# Patient Record
Sex: Female | Born: 1985 | Race: White | Hispanic: No | Marital: Single | Smoking: Current every day smoker
Health system: Southern US, Community
[De-identification: ages and names within clinical notes are randomized; demographics above are authoritative.]

## PROBLEM LIST (undated history)

## (undated) DIAGNOSIS — E079 Disorder of thyroid, unspecified: Secondary | ICD-10-CM

---

## 2016-07-21 ENCOUNTER — Emergency Department (HOSPITAL_COMMUNITY)
Admission: EM | Admit: 2016-07-21 | Discharge: 2016-07-21 | Disposition: A | Discharge status: Home / Self Care | Payer: Self-pay | Attending: Emergency Medicine | Admitting: Emergency Medicine

## 2016-07-21 ENCOUNTER — Encounter (HOSPITAL_COMMUNITY): Payer: Self-pay

## 2016-07-21 DIAGNOSIS — F17200 Nicotine dependence, unspecified, uncomplicated: Secondary | ICD-10-CM | POA: Insufficient documentation

## 2016-07-21 DIAGNOSIS — K069 Disorder of gingiva and edentulous alveolar ridge, unspecified: Secondary | ICD-10-CM | POA: Insufficient documentation

## 2016-07-21 DIAGNOSIS — K0889 Other specified disorders of teeth and supporting structures: Secondary | ICD-10-CM | POA: Insufficient documentation

## 2016-07-21 MED ORDER — LIDOCAINE VISCOUS 2 % MT SOLN: 15.0000 mL | OROMUCOSAL | 2 refills | Status: AC | PRN

## 2016-07-21 NOTE — Discharge Instructions (Signed)
You have been seen today for dental pain. You should follow up with a dentist as soon as possible. I have attached the resource guide below. This problem will not resolve on its own without the care of a dentist. Use ibuprofen or naproxen for pain. Use the viscous lidocaine for mouth pain. Swish with the lidocaine and spit it out. Do not swallow it. Please take OTC Omeprazole if you take ibuprofen daily. If you develop worsening or new concerning symptoms you can return to the emergency department for re-evaluation.

## 2016-07-21 NOTE — ED Triage Notes (Signed)
PT states her gums are receding and has gotten worse in the last week causing shooting pain to front lower teeth. PT states she saw a periodontist who told her she needs a graft, but she can not afford it

## 2016-07-21 NOTE — ED Provider Notes (Signed)
[facility]-EMERGENCY DEPT Provider Note   CSN: [id number] Arrival date & time: 07/21/16  1929     History   Chief Complaint Chief Complaint  Patient presents with  . Dental Pain    HPI Lori FosterJamie Sopp is a 31 y.o. female who presents with dental pain x 1 week. The patient was told 2 years ago by a periodontist that she would need a gum graft for her front bottom gums but was unable to afford it at that tiem. Over the last week she states that her gum recession has become increasingly worse and is now causing a shooting pain of the front lower teeth whenever she eats. The patient has been taking 800mg  Ibuprofen BID with relief. She is requesting dental resources so she can be seen and get this taken care of. She denies fever, chills, difficulty swallowing, inability to handle her secretions, sore throat, shortness of breath, drainage, or change in voice.    HPI  History reviewed. No pertinent past medical history.  There are no active problems to display for this patient.   History reviewed. No pertinent surgical history.  OB History    No data available       Home Medications    Prior to Admission medications   Medication Sig Start Date End Date Taking? Authorizing Provider  lidocaine (XYLOCAINE) 2 % solution Use as directed 15 mLs in the mouth or throat as needed for mouth pain. 07/21/16   Mithran Strike, Elmer SowMichael M, PA-C    Family History No family history on file.  Social History Social History  Substance Use Topics  . Smoking status: Current Every Day Smoker  . Smokeless tobacco: Never Used  . Alcohol use No     Allergies   Patient has no allergy information on record.   Review of Systems Review of Systems  Constitutional: Negative for chills and fatigue.  HENT: Positive for dental problem. Negative for drooling, ear pain, sinus pain, sore throat, trouble swallowing and voice change.   Eyes: Negative for pain.  Respiratory: Negative for shortness of breath.     Gastrointestinal: Negative for nausea and vomiting.     Physical Exam Updated Vital Signs BP 138/89 (BP Location: Left Arm)   Pulse 86   Temp 98.2 F (36.8 C) (Oral)   Resp 18   Ht 5\' 7"  (1.702 m)   Wt 114.8 kg (253 lb)   LMP 07/18/2016   SpO2 96%   BMI 39.63 kg/m   Physical Exam  Constitutional: She appears well-developed and well-nourished.  HENT:  Head: Normocephalic and atraumatic.  Right Ear: External ear normal.  Left Ear: External ear normal.  The patient has normal phonation and is in control of secretions. No stridor. Midline uvula, no tonsillar erythema or exudates. No creptius on neck palpation with full active rom. There is noticeable gum recession and expansion along the front lower gumline, with associated tenderness of the gingiva. No fluctuance or surrounding erythema or swelling. No evidence of drainage. Mucous membranes moist.    Eyes: Conjunctivae are normal. Right eye exhibits no discharge. Left eye exhibits no discharge. No scleral icterus.  Pulmonary/Chest: Effort normal. No respiratory distress.  Neurological: She is alert.  Skin: No pallor.  Psychiatric: She has a normal mood and affect.  Nursing note and vitals reviewed.    ED Treatments / Results  Labs (all labs ordered are listed, but only abnormal results are displayed) Labs Reviewed - No data to display  EKG  EKG Interpretation None  Radiology No results found.  Procedures Procedures (including critical care time)  Medications Ordered in ED Medications - No data to display   Initial Impression / Assessment and Plan / ED Course  I have reviewed the triage vital signs and the nursing notes.  Pertinent labs & imaging results that were available during my care of the patient were reviewed by me and considered in my medical decision making (see chart for details).     31 y.o. presenting with dental pain x 1 week. 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 She is requesting resources/referall so she can be seen for this. She is non-toxic on presentation with normal vital signs. On exam there is noticeable gum recession and expansion along the front lower gumline, with associated tenderness of the gingiva. There is no associated fluctuance or surrounding erythema or swelling. The patient is in control of her secretions with normal phonation. There is no tonsillar erythema or exudates. Uvula is midline. No creptius on neck palpation with full active rom.    Will discharge the patient home with resources to see a dentist. Will have the patient call and schedule an appointment with dentist for follow up and to provide additional treatment. Providing prescription for Lidocaine mouth wash for pain management. Patient told if she is to take ibuprofen for pain to take with food and to take daily PPI in addition with her history of GERD. Patient told they can return at anytime for worsening or new concerning symptoms. Patient verbalizes understanding of all instructions. All questions answered. No further questions at this time.    Final Clinical Impressions(s) / ED Diagnoses   Final diagnoses:  Pain, dental  Gum disease    New Prescriptions Discharge Medication List as of 07/21/2016  9:13 PM    START taking these medications   Details  lidocaine (XYLOCAINE) 2 % solution Use as directed 15 mLs in the mouth or throat as needed for mouth pain., Starting Mon 07/21/2016, Print         Gustava Berland, Elmer Sow, PA-C 07/22/16 0118    Lavera Guise, MD 07/22/16 [phone]

## 2018-10-18 ENCOUNTER — Other Ambulatory Visit: Payer: Self-pay

## 2018-10-18 ENCOUNTER — Encounter (HOSPITAL_BASED_OUTPATIENT_CLINIC_OR_DEPARTMENT_OTHER): Payer: Self-pay | Admitting: *Deleted

## 2018-10-18 ENCOUNTER — Emergency Department (HOSPITAL_BASED_OUTPATIENT_CLINIC_OR_DEPARTMENT_OTHER)
Admission: EM | Admit: 2018-10-18 | Discharge: 2018-10-18 | Disposition: A | Discharge status: Home / Self Care | Payer: 59 | Attending: Emergency Medicine | Admitting: Emergency Medicine

## 2018-10-18 ENCOUNTER — Emergency Department (HOSPITAL_BASED_OUTPATIENT_CLINIC_OR_DEPARTMENT_OTHER): Payer: 59

## 2018-10-18 DIAGNOSIS — R1011 Right upper quadrant pain: Secondary | ICD-10-CM

## 2018-10-18 DIAGNOSIS — N200 Calculus of kidney: Secondary | ICD-10-CM | POA: Insufficient documentation

## 2018-10-18 DIAGNOSIS — N12 Tubulo-interstitial nephritis, not specified as acute or chronic: Secondary | ICD-10-CM | POA: Diagnosis not present

## 2018-10-18 DIAGNOSIS — Z79899 Other long term (current) drug therapy: Secondary | ICD-10-CM | POA: Diagnosis not present

## 2018-10-18 DIAGNOSIS — F17210 Nicotine dependence, cigarettes, uncomplicated: Secondary | ICD-10-CM | POA: Diagnosis not present

## 2018-10-18 DIAGNOSIS — K529 Noninfective gastroenteritis and colitis, unspecified: Secondary | ICD-10-CM | POA: Insufficient documentation

## 2018-10-18 DIAGNOSIS — A599 Trichomoniasis, unspecified: Secondary | ICD-10-CM | POA: Diagnosis not present

## 2018-10-18 HISTORY — DX: Disorder of thyroid, unspecified: E07.9

## 2018-10-18 LAB — URINALYSIS, MICROSCOPIC (REFLEX)

## 2018-10-18 LAB — COMPREHENSIVE METABOLIC PANEL
ALT: 20 U/L (ref 0–44)
AST: 20 U/L (ref 15–41)
Albumin: 4 g/dL (ref 3.5–5.0)
Alkaline Phosphatase: 92 U/L (ref 38–126)
Anion gap: 12 (ref 5–15)
BUN: 9 mg/dL (ref 6–20)
CO2: 21 mmol/L — ABNORMAL LOW (ref 22–32)
Calcium: 9.6 mg/dL (ref 8.9–10.3)
Chloride: 102 mmol/L (ref 98–111)
Creatinine, Ser: 0.57 mg/dL (ref 0.44–1.00)
GFR calc Af Amer: >60 mL/min (ref >60)
GFR calc non Af Amer: >60 mL/min (ref >60)
Glucose, Bld: 95 mg/dL (ref 70–99)
Potassium: 4 mmol/L (ref 3.5–5.1)
Sodium: 135 mmol/L (ref 135–145)
Total Bilirubin: 0.4 mg/dL (ref 0.3–1.2)
Total Protein: 8.1 g/dL (ref 6.5–8.1)

## 2018-10-18 LAB — CBC
HCT: 43.2 % (ref 36.0–46.0)
Hemoglobin: 14.1 g/dL (ref 12.0–15.0)
MCH: 29.3 pg (ref 26.0–34.0)
MCHC: 32.6 g/dL (ref 30.0–36.0)
MCV: 89.8 fL (ref 80.0–100.0)
Platelets: 236 10*3/uL (ref 150–400)
RBC: 4.81 MIL/uL (ref 3.87–5.11)
RDW: 12.9 % (ref 11.5–15.5)
WBC: 11.6 10*3/uL — ABNORMAL HIGH (ref 4.0–10.5)
nRBC: 0 % (ref 0.0–0.2)

## 2018-10-18 LAB — URINALYSIS, ROUTINE W REFLEX MICROSCOPIC
Bilirubin Urine: NEGATIVE
Glucose, UA: NEGATIVE mg/dL
Ketones, ur: NEGATIVE mg/dL
Nitrite: NEGATIVE
Protein, ur: NEGATIVE mg/dL
Specific Gravity, Urine: >1.03 — ABNORMAL HIGH (ref 1.005–1.030)
pH: 6 (ref 5.0–8.0)

## 2018-10-18 LAB — WET PREP, GENITAL
Sperm: NONE SEEN
Yeast Wet Prep HPF POC: NONE SEEN

## 2018-10-18 LAB — LIPASE, BLOOD: Lipase: 37 U/L (ref 11–51)

## 2018-10-18 LAB — PREGNANCY, URINE: Preg Test, Ur: NEGATIVE

## 2018-10-18 MED ORDER — CEFTRIAXONE SODIUM 250 MG IJ SOLR
INTRAMUSCULAR | Status: AC
  Filled 2018-10-18: qty 250

## 2018-10-18 MED ORDER — CEPHALEXIN 500 MG PO CAPS: 500.0000 mg | ORAL_CAPSULE | Freq: Two times a day (BID) | ORAL | 0 refills | Status: AC

## 2018-10-18 MED ORDER — CIPROFLOXACIN HCL 500 MG PO TABS
500.0000 mg | ORAL_TABLET | Freq: Once | ORAL | Status: AC
  Administered 2018-10-18: 500 mg via ORAL
  Filled 2018-10-18: qty 1

## 2018-10-18 MED ORDER — CEPHALEXIN 250 MG PO CAPS
500.0000 mg | ORAL_CAPSULE | Freq: Once | ORAL | Status: AC
  Administered 2018-10-18: 500 mg via ORAL
  Filled 2018-10-18: qty 2

## 2018-10-18 MED ORDER — METRONIDAZOLE 500 MG PO TABS
500.0000 mg | ORAL_TABLET | Freq: Once | ORAL | Status: AC
  Administered 2018-10-18: 500 mg via ORAL
  Filled 2018-10-18: qty 1

## 2018-10-18 MED ORDER — CEFTRIAXONE SODIUM 1 G IJ SOLR: 500.0000 mg | Freq: Once | INTRAMUSCULAR | Status: DC

## 2018-10-18 MED ORDER — SODIUM CHLORIDE 0.9 % IV BOLUS
1000.0000 mL | Freq: Once | INTRAVENOUS | Status: AC
  Administered 2018-10-18: 1000 mL via INTRAVENOUS

## 2018-10-18 MED ORDER — CIPROFLOXACIN HCL 500 MG PO TABS: 500.0000 mg | ORAL_TABLET | Freq: Two times a day (BID) | ORAL | 0 refills | Status: AC

## 2018-10-18 MED ORDER — HYDROCODONE-ACETAMINOPHEN 5-325 MG PO TABS: 1.0000 | ORAL_TABLET | ORAL | 0 refills | Status: AC | PRN

## 2018-10-18 MED ORDER — METRONIDAZOLE 500 MG PO TABS: 500.0000 mg | ORAL_TABLET | Freq: Three times a day (TID) | ORAL | 0 refills | Status: AC

## 2018-10-18 MED ORDER — KETOROLAC TROMETHAMINE 30 MG/ML IJ SOLN
30.0000 mg | Freq: Once | INTRAMUSCULAR | Status: AC
  Administered 2018-10-18: 30 mg via INTRAVENOUS
  Filled 2018-10-18: qty 1

## 2018-10-18 MED ORDER — CEFTRIAXONE SODIUM 250 MG IJ SOLR
INTRAMUSCULAR | Status: AC
  Administered 2018-10-18: 500 mg
  Filled 2018-10-18: qty 500

## 2018-10-18 MED ORDER — MORPHINE SULFATE (PF) 4 MG/ML IV SOLN
4.0000 mg | Freq: Once | INTRAVENOUS | Status: DC
  Filled 2018-10-18: qty 1

## 2018-10-18 MED ORDER — AZITHROMYCIN 1 G PO PACK
1.0000 g | PACK | Freq: Once | ORAL | Status: AC
  Administered 2018-10-18: 1 g via ORAL
  Filled 2018-10-18: qty 1

## 2018-10-18 MED ORDER — ONDANSETRON HCL 4 MG PO TABS: 4.0000 mg | ORAL_TABLET | Freq: Three times a day (TID) | ORAL | 0 refills | Status: AC | PRN

## 2018-10-18 NOTE — Discharge Instructions (Signed)
Your work-up today was significant for several findings including colitis on the right side of her abdomen, a kidney stone in your right kidney, urinary tract infection, and trichomonas.  You were treated empirically for other sexually transmitted infections and swabs for GC/chlamydia were sent.  They will call you if they are positive.  Please use the pain medicine, nausea medicine, and maintain your hydration.  Please take the antibiotics.  If any symptoms change or worsen, please return to the nearest emergency department.  Please rest.

## 2018-10-18 NOTE — ED Notes (Signed)
Pt. Reports to edp she started the KETO diet several weeks ago also.  Pt. Using AZO for pain

## 2018-10-18 NOTE — ED Notes (Signed)
Pt. Wants med that will not alter her normal being if poss. So she refused the Morphine for her pain.

## 2018-10-18 NOTE — ED Triage Notes (Signed)
Right mid to upper quadrant pain since this am. Keto diet x 2 weeks.

## 2018-10-18 NOTE — ED Provider Notes (Signed)
[facility]PART[facility] Provider Note   CSN: [id number] Arrival date & time: 10/18/18  1701     History   Chief Complaint Chief Complaint  Patient presents with  . Abdominal Pain  . Back Pain    HPI Lori Coleman is a 33 y.o. female.     The history is provided by the patient and medical records. No language interpreter was used.  Abdominal Pain Pain location:  R flank and RUQ Pain quality: sharp and shooting   Pain radiates to:  R flank and back Pain severity:  Severe Onset quality:  Sudden Duration:  1 day Timing:  Constant Progression:  Waxing and waning Chronicity:  New Context: not trauma   Relieved by:  Nothing Worsened by:  Urination, eating and palpation Ineffective treatments:  None tried Associated symptoms: dysuria and vaginal discharge   Associated symptoms: no chest pain, no chills, no constipation, no cough, no diarrhea, no fatigue, no fever, no nausea, no shortness of breath, no vaginal bleeding and no vomiting   Back Pain Associated symptoms: abdominal pain and dysuria   Associated symptoms: no chest pain, no fever and no headaches     Past Medical History:  Diagnosis Date  . Thyroid disease     There are no active problems to display for this patient.   History reviewed. No pertinent surgical history.   OB History   No obstetric history on file.      Home Medications    Prior to Admission medications   Medication Sig Start Date End Date Taking? Authorizing Provider  levothyroxine (SYNTHROID) 88 MCG tablet Take by mouth. 08/17/18  Yes [provider]  lidocaine (XYLOCAINE) 2 % solution Use as directed 15 mLs in the mouth or throat as needed for mouth pain. 07/21/16   Maczis, Barth Kirks, PA-C    Family History No family history on file.  Social History Social History   Tobacco Use  . Smoking status: Current Every Day Smoker  . Smokeless tobacco: Never Used  Substance Use Topics  . Alcohol use: Yes  .  Drug use: No     Allergies   Patient has no known allergies.   Review of Systems Review of Systems  Constitutional: Negative for chills, diaphoresis, fatigue and fever.  HENT: Negative for congestion.   Respiratory: Negative for cough, choking, chest tightness, shortness of breath and wheezing.   Cardiovascular: Negative for chest pain, palpitations and leg swelling.  Gastrointestinal: Positive for abdominal pain. Negative for constipation, diarrhea, nausea and vomiting.  Genitourinary: Positive for dysuria, flank pain and vaginal discharge. Negative for decreased urine volume, frequency, vaginal bleeding and vaginal pain.  Musculoskeletal: Positive for back pain. Negative for neck pain and neck stiffness.  Skin: Negative for rash and wound.  Neurological: Negative for light-headedness and headaches.  Psychiatric/Behavioral: Negative for agitation.  All other systems reviewed and are negative.    Physical Exam Updated Vital Signs BP (!) 151/95 (BP Location: Right Arm)   Pulse 80   Temp 98.8 F (37.1 C) (Oral)   Resp 18   Ht 5\' 6"  (1.676 m)   Wt 122.5 kg   LMP 10/05/2018   SpO2 99%   BMI 43.58 kg/m   Physical Exam Vitals signs and nursing note reviewed.  Constitutional:      General: She is not in acute distress.    Appearance: She is well-developed. She is not ill-appearing, toxic-appearing or diaphoretic.  HENT:     Head: Normocephalic and atraumatic.  Right Ear: External ear normal.     Left Ear: External ear normal.     Nose: Nose normal.     Mouth/Throat:     Pharynx: No oropharyngeal exudate.  Eyes:     Conjunctiva/sclera: Conjunctivae normal.     Pupils: Pupils are equal, round, and reactive to light.  Neck:     Musculoskeletal: Normal range of motion and neck supple.  Cardiovascular:     Rate and Rhythm: Normal rate.     Heart sounds: Normal heart sounds. No murmur.  Pulmonary:     Effort: Pulmonary effort is normal. No respiratory distress.      Breath sounds: No stridor.  Abdominal:     General: Abdomen is flat. Bowel sounds are normal. There is no distension.     Tenderness: There is abdominal tenderness in the right upper quadrant and epigastric area. There is no right CVA tenderness, left CVA tenderness or rebound.  Genitourinary:    Labia:        Right: No tenderness.        Left: No tenderness.      Vagina: Vaginal discharge (minimal) present. No tenderness or bleeding.     Cervix: No cervical motion tenderness, discharge, friability, erythema or cervical bleeding.     Uterus: Not tender.      Adnexa:        Right: No tenderness.         Left: No tenderness.    Skin:    General: Skin is warm.     Capillary Refill: Capillary refill takes less than 2 seconds.     Findings: No erythema or rash.  Neurological:     General: No focal deficit present.     Mental Status: She is alert.     Motor: No abnormal muscle tone.     Coordination: Coordination normal.     Deep Tendon Reflexes: Reflexes are normal and symmetric.  Psychiatric:        Mood and Affect: Mood normal.      ED Treatments / Results  Labs (all labs ordered are listed, but only abnormal results are displayed) Labs Reviewed  WET PREP, GENITAL - Abnormal; Notable for the following components:      Result Value   Trich, Wet Prep PRESENT (*)    Clue Cells Wet Prep HPF POC PRESENT (*)    WBC, Wet Prep HPF POC MODERATE (*)    All other components within normal limits  URINALYSIS, ROUTINE W REFLEX MICROSCOPIC - Abnormal; Notable for the following components:   Specific Gravity, Urine >1.030 (*)    Hgb urine dipstick LARGE (*)    Leukocytes,Ua TRACE (*)    All other components within normal limits  COMPREHENSIVE METABOLIC PANEL - Abnormal; Notable for the following components:   CO2 21 (*)    All other components within normal limits  CBC - Abnormal; Notable for the following components:   WBC 11.6 (*)    All other components within normal limits   URINALYSIS, MICROSCOPIC (REFLEX) - Abnormal; Notable for the following components:   Bacteria, UA MANY (*)    Trichomonas, UA PRESENT (*)    All other components within normal limits  URINE CULTURE  PREGNANCY, URINE  LIPASE, BLOOD  GC/CHLAMYDIA PROBE AMP ([place]) NOT AT [facility]    EKG None  Radiology Ct Renal Stone Study  Result Date: 10/18/2018 CLINICAL DATA:  Right mid to upper quadrant pain EXAM: CT ABDOMEN AND PELVIS WITHOUT CONTRAST TECHNIQUE: Multidetector  CT imaging of the abdomen and pelvis was performed following the standard protocol without IV contrast. COMPARISON:  01/23/2017 FINDINGS: Lower chest: Lung bases are clear. No effusions. Heart is normal size. Hepatobiliary: No focal hepatic abnormality. Gallbladder unremarkable. Pancreas: No focal abnormality or ductal dilatation. Spleen: No focal abnormality.  Normal size. Adrenals/Urinary Tract: Punctate nonobstructing stone in the lower pole of the right kidney. No ureteral stones or hydronephrosis. Adrenal glands and urinary bladder unremarkable. Stomach/Bowel: Normal appendix. Mild wall thickening in the cecum and ascending colon. This is decompressed but concerning for possible colitis. Stomach and small bowel decompressed, unremarkable. Vascular/Lymphatic: No evidence of aneurysm or adenopathy. Reproductive: Uterus and adnexa unremarkable.  No mass. Other: No free fluid or free air. Musculoskeletal: No acute bony abnormality. IMPRESSION: While decompressed, there is concern for mild wall thickening in the cecum and ascending colon suggesting colitis. Punctate right lower pole nephrolithiasis. No ureteral stones or hydronephrosis. Electronically Signed   By: Charlett Nose M.D.   On: 10/18/2018 18:31    Procedures Procedures (including critical care time)  Medications Ordered in ED Medications  morphine 4 MG/ML injection 4 mg (4 mg Intravenous Not Given 10/18/18 1837)  cefTRIAXone (ROCEPHIN) injection 500 mg (500 mg  Intramuscular Not Given 10/18/18 2050)  cefTRIAXone (ROCEPHIN) 250 MG injection (has no administration in time range)  sodium chloride 0.9 % bolus 1,000 mL (0 mLs Intravenous Stopped 10/18/18 1953)  ketorolac (TORADOL) 30 MG/ML injection 30 mg (30 mg Intravenous Given 10/18/18 1852)  azithromycin (ZITHROMAX) powder 1 g (1 g Oral Given 10/18/18 2047)  metroNIDAZOLE (FLAGYL) tablet 500 mg (500 mg Oral Given 10/18/18 2045)  cephALEXin (KEFLEX) capsule 500 mg (500 mg Oral Given 10/18/18 2045)  ciprofloxacin (CIPRO) tablet 500 mg (500 mg Oral Given 10/18/18 2045)  cefTRIAXone (ROCEPHIN) 250 MG injection (500 mg  Given 10/18/18 2048)     Initial Impression / Assessment and Plan / ED Course  I have reviewed the triage vital signs and the nursing notes.  Pertinent labs & imaging results that were available during my care of the patient were reviewed by me and considered in my medical decision making (see chart for details).        Traci Peers is a 32 y.o. female with a past medical history significant for thyroid disease who presents with abdominal pain.  Patient reports that she started the keto diet several weeks ago and has made significant dietary changes.  She reports that she has had pain in her right flank, back, and abdomen today.  She also reports has had some dysuria and some vaginal discharge for the last few days.  She denies any significant nausea Or vomiting.  She denies fevers, chills, chest pain, shortness of breath, congestion, or cough.  She denies any chest pain or upper back pain.  She has no history of kidney stones or family history to her knowledge.  She denies any medication changes.  She has had no trauma.  She reports the pain is 10 out of 10, sharp, and cannot let her get comfortable.  On exam, patient does have right-sided CVA tenderness and right flank tenderness.  Abdomen otherwise had tenderness in her right upper quadrant..  Normal sensation and strength in extremities.  Lungs  clear.  Upper back nontender, chest is nontender.  No rash seen that would indicate shingles.  Clinically I am concerned about several things as etiology of her symptoms.  Patient's initial urinalysis does show evidence of leukocytes and bacteria, suspect UTI versus pyelonephritis.  Trichomonas was seen in the urine, will get pelvic exam to collect GC/chlamydia swabs.  Will treat empirically for STI and trichomonas.  Due to the likely dehydration with her dietary changes and a new right flank pain with blood in her urine, I am concerned about kidney stone however her abdomen was tender to evaluation in the right upper quadrant concerning for a gallbladder pathology.  Given the recent dietary changes, her gallbladder could have acute cholecystitis.  She will be given fluids and pain medication and will have right upper quadrant ultrasound with the stone study.  Anticipate reassessment.  Pelvic exam had minimal discharge and no bleeding.  No cervical motion tenderness suggestive of PID or TOA.  Patient's work-up reveals several findings.  Patient was found to have the trichomonas on urine, will be empirically treated for other STI.  The urine does show evidence of leukocytes and bacteria, will treat for UTI and pyelonephritis given the pain going to her flank.  CT scan showed concern for colitis, will give Cipro and Flagyl prescription.  Patient will give prescription for pain and nausea medicine given the discomfort.    Patient understood return precautions and follow instructions.  She no other questions or concerns and was discharged for outpatient treatment of her colitis, pyelonephritis, and STI.    Final Clinical Impressions(s) / ED Diagnoses   Final diagnoses:  RUQ abdominal pain  Pyelonephritis  Trichimoniasis  Colitis  Kidney stone    ED Discharge Orders         Ordered    ciprofloxacin (CIPRO) 500 MG tablet  Every 12 hours     10/18/18 2032    metroNIDAZOLE (FLAGYL) 500 MG tablet   3 times daily     10/18/18 2032    cephALEXin (KEFLEX) 500 MG capsule  2 times daily     10/18/18 2032    HYDROcodone-acetaminophen (NORCO/VICODIN) 5-325 MG tablet  Every 4 hours PRN     10/18/18 2032    ondansetron (ZOFRAN) 4 MG tablet  Every 8 hours PRN     10/18/18 2032         Clinical Impression: 1. Pyelonephritis   2. RUQ abdominal pain   3. Trichimoniasis   4. Colitis   5. Kidney stone     Disposition: Discharge  Condition: Good  I have discussed the results, Dx and Tx plan with the pt(& family if present). He/she/they expressed understanding and agree(s) with the plan. Discharge instructions discussed at great length. Strict return precautions discussed and pt &/or family have verbalized understanding of the instructions. No further questions at time of discharge.    New Prescriptions   CEPHALEXIN (KEFLEX) 500 MG CAPSULE    Take 1 capsule (500 mg total) by mouth 2 (two) times daily for 14 days.   CIPROFLOXACIN (CIPRO) 500 MG TABLET    Take 1 tablet (500 mg total) by mouth every 12 (twelve) hours for 7 days.   HYDROCODONE-ACETAMINOPHEN (NORCO/VICODIN) 5-325 MG TABLET    Take 1 tablet by mouth every 4 (four) hours as needed.   METRONIDAZOLE (FLAGYL) 500 MG TABLET    Take 1 tablet (500 mg total) by mouth 3 (three) times daily for 7 days.   ONDANSETRON (ZOFRAN) 4 MG TABLET    Take 1 tablet (4 mg total) by mouth every 8 (eight) hours as needed.    Follow Up: No follow-up provider specified.    Niles Ess, Canary Brimhristopher J, MD 10/18/18 2100

## 2018-10-19 LAB — URINE CULTURE: Culture: NO GROWTH

## 2018-10-20 LAB — CERVICOVAGINAL ANCILLARY ONLY
Chlamydia: NEGATIVE
Neisseria Gonorrhea: NEGATIVE

## 2020-05-24 IMAGING — CT CT RENAL STONE PROTOCOL
2 of 4 series · 17 of 46 positions shown, 19 images · non-contrast
Comparison: 01/23/2017

CLINICAL DATA: Right mid to upper quadrant pain

EXAM:
CT ABDOMEN AND PELVIS WITHOUT CONTRAST
TECHNIQUE: Multidetector CT imaging of the abdomen and pelvis was performed
following the standard protocol without IV contrast.

[Series 2: axial st · axial · 0.76mm/px · z∈[+810,+1220]mm · 14 of 90 slices shown, 16 images]
[im 4/90  soft-tissue]
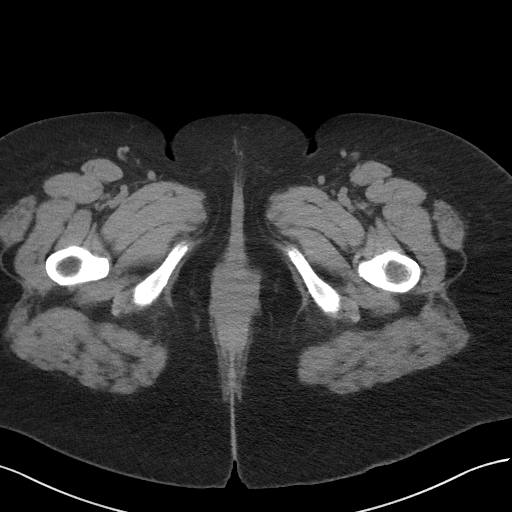
[im 4/90  bone]
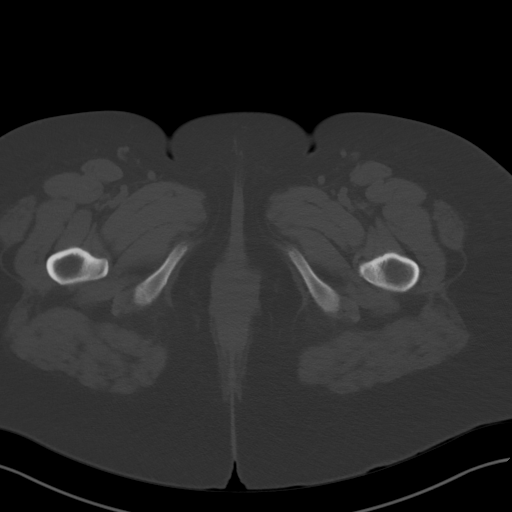
[im 12/90  soft-tissue]
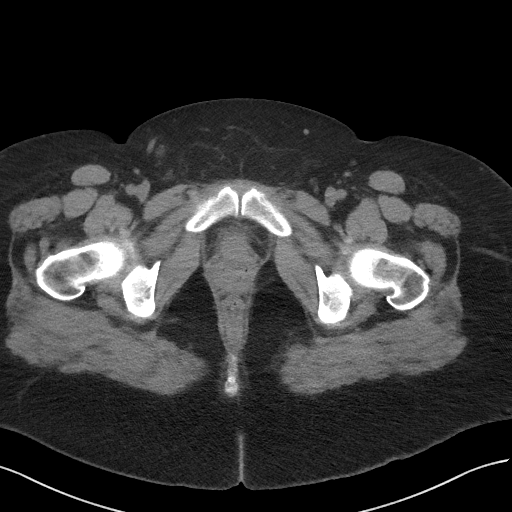
[im 19/90  soft-tissue]
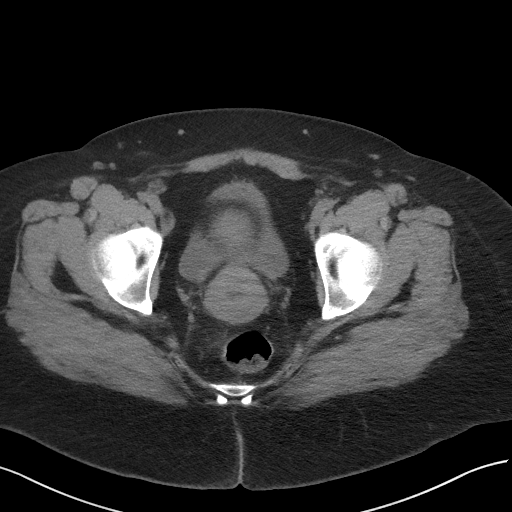
[im 23/90  soft-tissue]
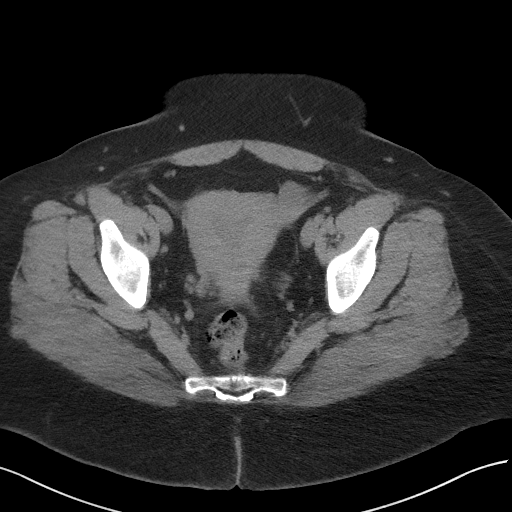
[im 30/90  soft-tissue]
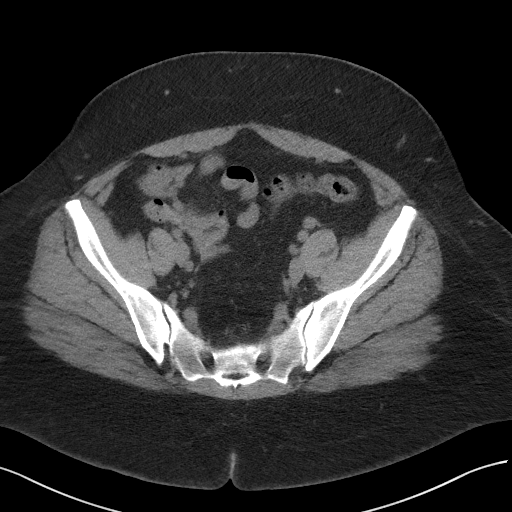
[im 38/90  soft-tissue]
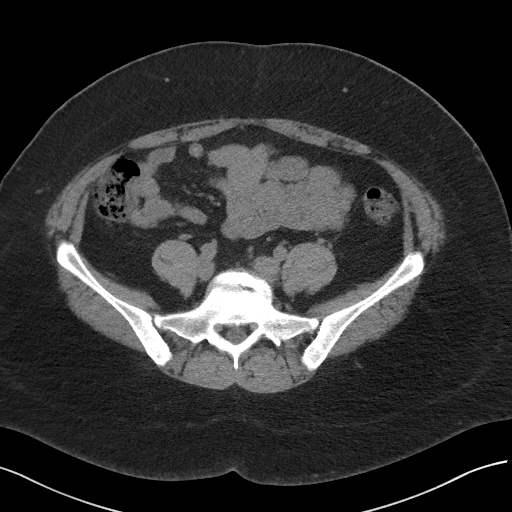
[im 41/90  soft-tissue]
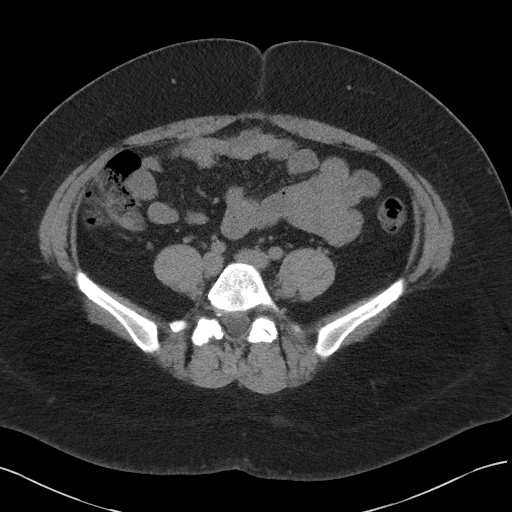
[im 49/90  soft-tissue]
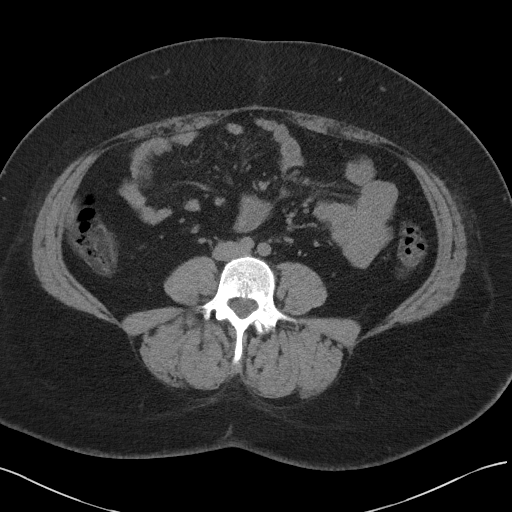
[im 52/90  soft-tissue]
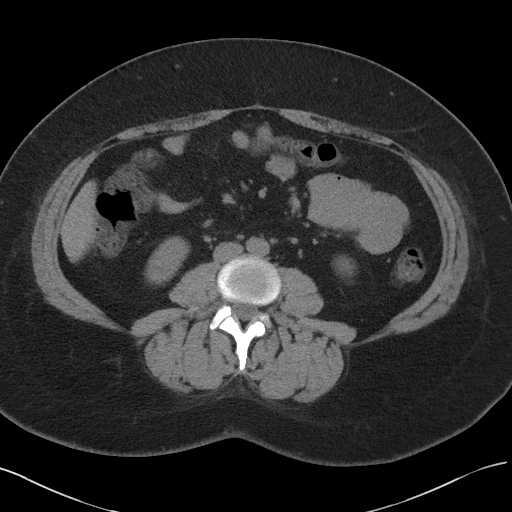
[im 52/90  bone]
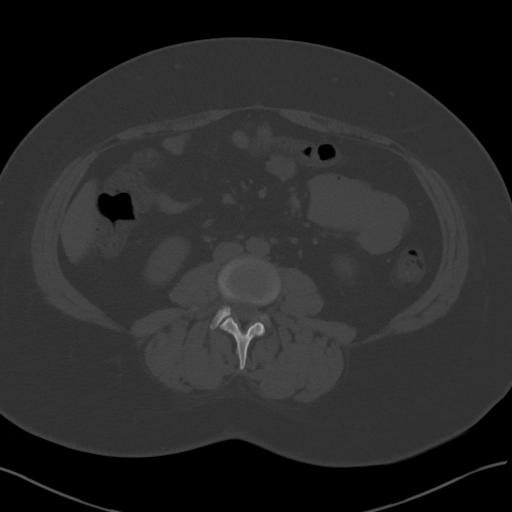
[im 60/90  soft-tissue]
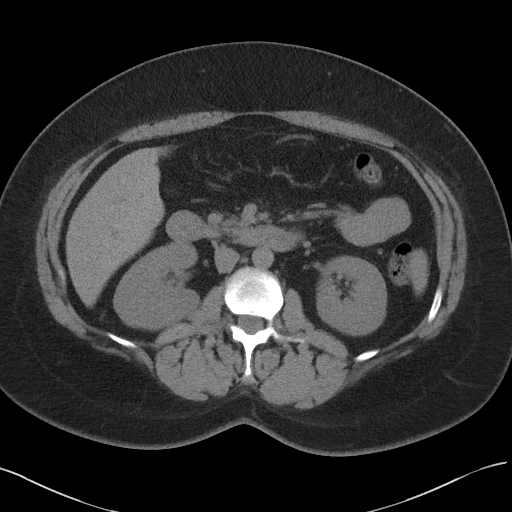
[im 67/90  soft-tissue]
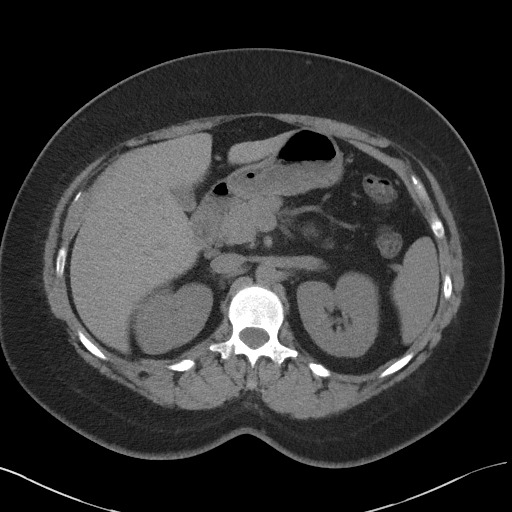
[im 71/90  soft-tissue]
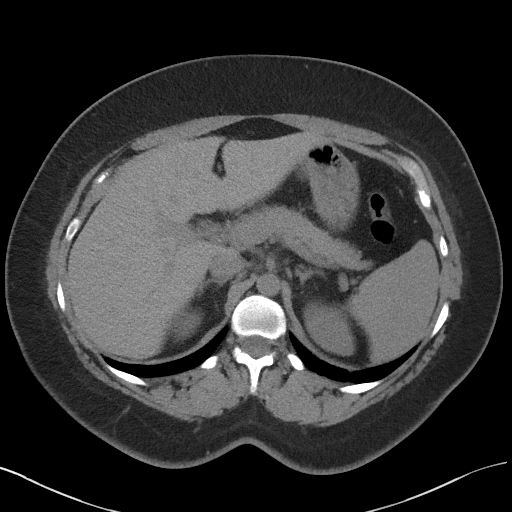
[im 78/90  soft-tissue]
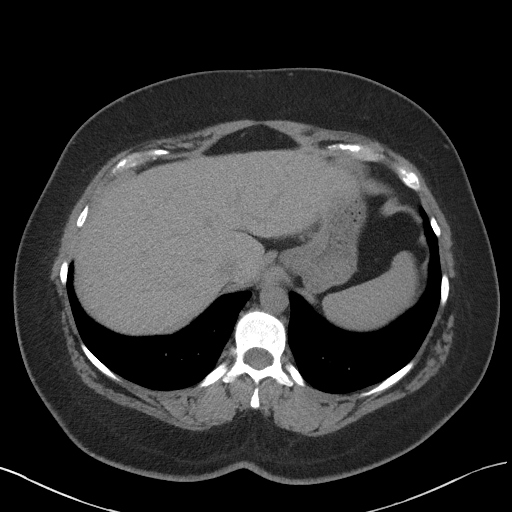
[im 86/90  soft-tissue]
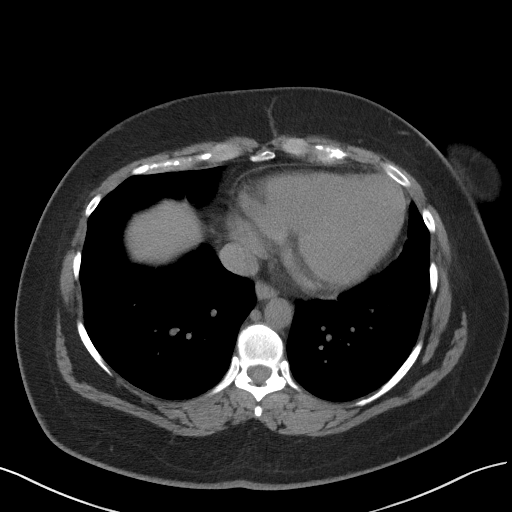

[Series 4: coronal st · coronal · 0.80mm/px · 3 of 114 slices shown]
[im 38/114  soft-tissue]
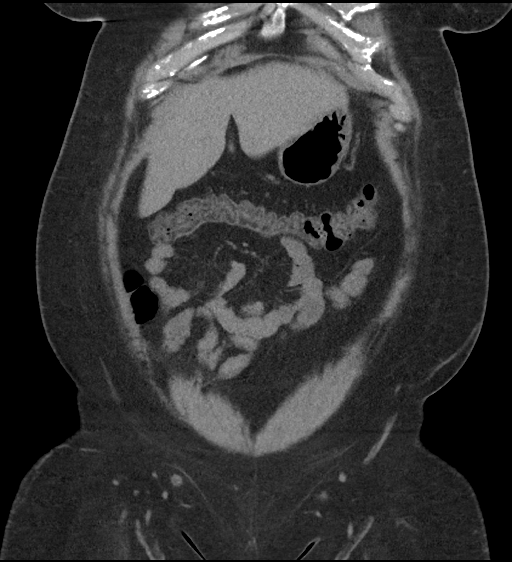
[im 51/114  soft-tissue]
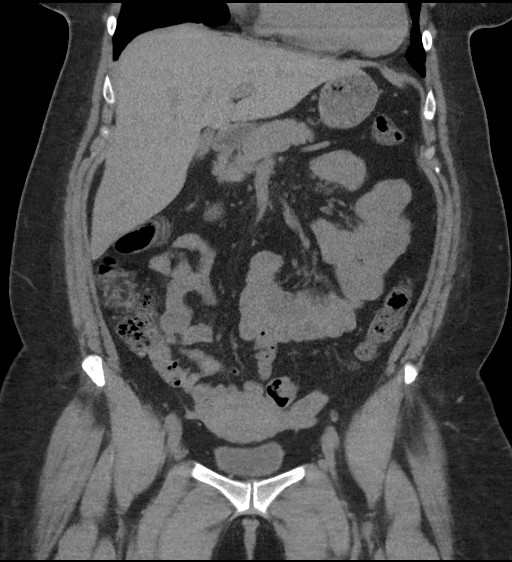
[im 63/114  soft-tissue]
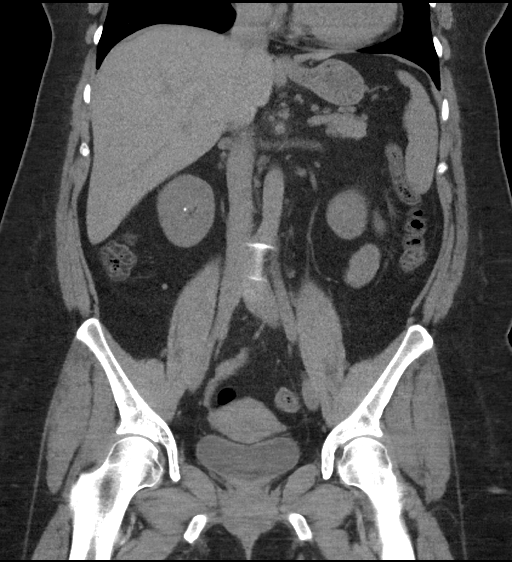

[17 of 46 positions shown; findings below may reference images not displayed]

FINDINGS: Lower chest: Lung bases are clear. No effusions. Heart is normal
size.

Hepatobiliary: No focal hepatic abnormality. Gallbladder
unremarkable.

Pancreas: No focal abnormality or ductal dilatation.

Spleen: No focal abnormality.  Normal size.

Adrenals/Urinary Tract: Punctate nonobstructing stone in the lower
pole of the right kidney. No ureteral stones or hydronephrosis.
Adrenal glands and urinary bladder unremarkable.

Stomach/Bowel: Normal appendix. Mild wall thickening in the cecum
and ascending colon. This is decompressed but concerning for
possible colitis. Stomach and small bowel decompressed,
unremarkable.

Vascular/Lymphatic: No evidence of aneurysm or adenopathy.

Reproductive: Uterus and adnexa unremarkable.  No mass.

Other: No free fluid or free air.

Musculoskeletal: No acute bony abnormality.
IMPRESSION: While decompressed, there is concern for mild wall thickening in the
cecum and ascending colon suggesting colitis.

Punctate right lower pole nephrolithiasis. No ureteral stones or
hydronephrosis.

## 2023-12-02 ENCOUNTER — Emergency Department (HOSPITAL_BASED_OUTPATIENT_CLINIC_OR_DEPARTMENT_OTHER)
Admission: EM | Admit: 2023-12-02 | Discharge: 2023-12-02 | Disposition: A | Discharge status: Home / Self Care | Attending: Emergency Medicine | Admitting: Emergency Medicine

## 2023-12-02 ENCOUNTER — Other Ambulatory Visit: Payer: Self-pay

## 2023-12-02 ENCOUNTER — Encounter (HOSPITAL_BASED_OUTPATIENT_CLINIC_OR_DEPARTMENT_OTHER): Payer: Self-pay | Admitting: Emergency Medicine

## 2023-12-02 DIAGNOSIS — R300 Dysuria: Secondary | ICD-10-CM | POA: Diagnosis present

## 2023-12-02 DIAGNOSIS — N3000 Acute cystitis without hematuria: Secondary | ICD-10-CM | POA: Insufficient documentation

## 2023-12-02 LAB — URINALYSIS, ROUTINE W REFLEX MICROSCOPIC
Bilirubin Urine: NEGATIVE
Glucose, UA: NEGATIVE mg/dL
Ketones, ur: NEGATIVE mg/dL
Nitrite: NEGATIVE
Protein, ur: 30 mg/dL — AB
RBC / HPF: >50 RBC/hpf (ref 0–5)
Specific Gravity, Urine: 1.021 (ref 1.005–1.030)
WBC, UA: >50 WBC/hpf (ref 0–5)
pH: 6.5 (ref 5.0–8.0)

## 2023-12-02 LAB — PREGNANCY, URINE: Preg Test, Ur: NEGATIVE

## 2023-12-02 MED ORDER — CEPHALEXIN 500 MG PO CAPS: 500.0000 mg | ORAL_CAPSULE | Freq: Three times a day (TID) | ORAL | 0 refills | Status: AC

## 2023-12-02 MED ORDER — CEPHALEXIN 250 MG PO CAPS
500.0000 mg | ORAL_CAPSULE | Freq: Once | ORAL | Status: AC
  Administered 2023-12-02: 500 mg via ORAL
  Filled 2023-12-02: qty 2

## 2023-12-02 NOTE — Discharge Instructions (Signed)
 You were seen today found to have a urinary tract infection.  Take antibiotics as prescribed.  If you develop back pain or fevers, you should be reevaluated.

## 2023-12-02 NOTE — ED Provider Notes (Signed)
 [facility] AT [facility] Provider Note   CSN: [id number] Arrival date & time: 12/02/23  9643     Patient presents with: Dysuria and Back Pain   Lori Coleman is a 38 y.o. female.   HPI     This is a 38 year old female who presents with concern for UTI.  Patient reports she has developed dysuria and frequency and urgency.  Recently was treated for trichomonas with Flagyl  but states that those symptoms had improved in general.  She is no longer having vaginal discharge or odor.  However she did resume sexual activity on Sunday and since that time has developed some dysuria.  No fevers or back pain.  Prior to Admission medications   Medication Sig Start Date End Date Taking? Authorizing Provider  cephALEXin  (KEFLEX ) 500 MG capsule Take 1 capsule (500 mg total) by mouth 3 (three) times daily. 12/02/23  Yes Arshia Spellman, Charmaine FALCON, MD  HYDROcodone -acetaminophen  (NORCO/VICODIN) 5-325 MG tablet Take 1 tablet by mouth every 4 (four) hours as needed. 10/18/18   Tegeler, Lonni PARAS, MD  levothyroxine (SYNTHROID) 88 MCG tablet Take by mouth. 08/17/18   [provider]  lidocaine  (XYLOCAINE ) 2 % solution Use as directed 15 mLs in the mouth or throat as needed for mouth pain. 07/21/16   Maczis, Michael M, PA-C  ondansetron  (ZOFRAN ) 4 MG tablet Take 1 tablet (4 mg total) by mouth every 8 (eight) hours as needed. 10/18/18   Tegeler, Lonni PARAS, MD    Allergies: Patient has no known allergies.    Review of Systems  Constitutional:  Negative for fever.  Respiratory:  Negative for shortness of breath.   Cardiovascular:  Negative for chest pain.  Gastrointestinal:  Negative for abdominal pain.  Genitourinary:  Positive for dysuria, frequency and urgency.  All other systems reviewed and are negative.   Updated Vital Signs BP (!) 135/54 (BP Location: Right Arm)   Pulse 75   Temp 98.2 F (36.8 C) (Oral)   Resp 18   Ht 1.676 m (5' 6)   Wt 113.4 kg   LMP  11/04/2023   SpO2 100%   BMI 40.35 kg/m   Physical Exam Vitals and nursing note reviewed.  Constitutional:      Appearance: She is well-developed. She is not ill-appearing.  HENT:     Head: Normocephalic and atraumatic.  Eyes:     Pupils: Pupils are equal, round, and reactive to light.  Cardiovascular:     Rate and Rhythm: Normal rate and regular rhythm.  Pulmonary:     Effort: Pulmonary effort is normal. No respiratory distress.  Abdominal:     Palpations: Abdomen is soft.     Tenderness: There is no abdominal tenderness. There is no right CVA tenderness or left CVA tenderness.  Musculoskeletal:     Cervical back: Neck supple.  Skin:    General: Skin is warm and dry.  Neurological:     Mental Status: She is alert and oriented to person, place, and time.     (all labs ordered are listed, but only abnormal results are displayed) Labs Reviewed  URINALYSIS, ROUTINE W REFLEX MICROSCOPIC - Abnormal; Notable for the following components:      Result Value   APPearance CLOUDY (*)    Hgb urine dipstick LARGE (*)    Protein, ur 30 (*)    Leukocytes,Ua LARGE (*)    Bacteria, UA RARE (*)    All other components within normal limits  URINE CULTURE  PREGNANCY, URINE  EKG: None  Radiology: No results found.   Procedures   Medications Ordered in the ED  cephALEXin  (KEFLEX ) capsule 500 mg (500 mg Oral Given 12/02/23 0527)                                    Medical Decision Making Amount and/or Complexity of Data Reviewed Labs: ordered.  Risk Prescription drug management.   This patient presents to the ED for concern of urinary symptoms, this involves an extensive number of treatment options, and is a complaint that carries with it a high risk of complications and morbidity.  I considered the following differential and admission for this acute, potentially life threatening condition.  The differential diagnosis includes status, pyelonephritis, recurrent STI  MDM:     This is a 38 year old female who presents with dysuria, urgency, frequency.  She is nontoxic and vital signs are reassuring.  She denies systemic symptoms.  Recent treatment for trichomonas.  She reports that she finished a full course and improvement of symptoms.  Urinalysis is consistent with UTI with large leukocyte esterase, greater than 50 white cells and bacteria.  Will send for urine culture.  Start her on Keflex .  No residual trichomonas noted in the urine.  (Labs, imaging, consults)  Labs: I Ordered, and personally interpreted labs.  The pertinent results include: Urinalysis, urine pregnancy  Imaging Studies ordered: I ordered imaging studies including none I independently visualized and interpreted imaging. I agree with the radiologist interpretation  Additional history obtained from chart review.  External records from outside source obtained and reviewed including primary notes  Cardiac Monitoring: The patient was not maintained on a cardiac monitor.  If on the cardiac monitor, I personally viewed and interpreted the cardiac monitored which showed an underlying rhythm of: N/A  Reevaluation: After the interventions noted above, I reevaluated the patient and found that they have :stayed the same  Social Determinants of Health:  lives independently  Disposition: Discharge  Co morbidities that complicate the patient evaluation  Past Medical History:  Diagnosis Date   Thyroid disease      Medicines Meds ordered this encounter  Medications   cephALEXin  (KEFLEX ) capsule 500 mg   cephALEXin  (KEFLEX ) 500 MG capsule    Sig: Take 1 capsule (500 mg total) by mouth 3 (three) times daily.    Dispense:  21 capsule    Refill:  0    I have reviewed the patients home medicines and have made adjustments as needed  Problem List / ED Course: Problem List Items Addressed This Visit   None Visit Diagnoses       Acute cystitis without hematuria    -  Primary                 Final diagnoses:  Acute cystitis without hematuria    ED Discharge Orders          Ordered    cephALEXin  (KEFLEX ) 500 MG capsule  3 times daily        12/02/23 0543               Bari Charmaine FALCON, MD 12/02/23 [phone]

## 2023-12-02 NOTE — ED Triage Notes (Signed)
  Patient comes in with dysuria and lower back pain that started last night.  Patient states she recently was treated for STI and finished her antibiotics.  Patient thought symptoms were from her getting ready to start her period but was worse this morning when she woke up.  Denies any vaginal discharge.  Pain 6/10, sharp/stabbing.

## 2023-12-04 LAB — URINE CULTURE: Culture: 80000 — AB

## 2023-12-05 ENCOUNTER — Telehealth (HOSPITAL_BASED_OUTPATIENT_CLINIC_OR_DEPARTMENT_OTHER): Payer: Self-pay | Admitting: Emergency Medicine

## 2023-12-05 NOTE — Telephone Encounter (Signed)
 Post ED Visit - Positive Culture Follow-up  Culture report reviewed by antimicrobial stewardship pharmacist: [facility] []  Rankin Dee, Pharm.D. []  Venetia Gully, Pharm.D., BCPS AQ-ID []  Garrel Crews, Pharm.D., BCPS []  Almarie Lunger, Pharm.D., BCPS []  [place], [address].D., BCPS, AAHIVP []  Rosaline Bihari, Pharm.D., BCPS, AAHIVP []  Vernell Meier, PharmD, BCPS []  Latanya Hint, PharmD, BCPS []  Donald Medley, PharmD, BCPS [x]  Dorn Buttner, PharmD []  Dorothyann Alert, PharmD, BCPS []  Morene Babe, PharmD  [facility] []  Rosaline Edison, PharmD []  Romona Bliss, PharmD []  Dolphus Roller, PharmD []  Veva Seip, Rph []  Vernell Daunt) Leonce, PharmD []  Eva Allis, PharmD []  Rosaline Millet, PharmD []  Iantha Batch, PharmD []  Arvin Gauss, PharmD []  Wanda Hasting, PharmD []  Ronal Rav, PharmD []  Rocky Slade, PharmD []  Bard Jeans, PharmD   Positive urine culture Treated with Cephalexin , organism sensitive to the same and no further patient follow-up is required at this time.  Lori Coleman 12/05/2023, 8:38 PM
